# Patient Record
Sex: Male | Born: 1970 | Race: White | Hispanic: No | Marital: Married | Smoking: Never smoker
Health system: Southern US, Community
[De-identification: ages and names within clinical notes are randomized; demographics above are authoritative.]

## PROBLEM LIST (undated history)

## (undated) DIAGNOSIS — G43909 Migraine, unspecified, not intractable, without status migrainosus: Secondary | ICD-10-CM

## (undated) DIAGNOSIS — I10 Essential (primary) hypertension: Secondary | ICD-10-CM

## (undated) DIAGNOSIS — E782 Mixed hyperlipidemia: Secondary | ICD-10-CM

## (undated) DIAGNOSIS — R51 Headache: Secondary | ICD-10-CM

## (undated) DIAGNOSIS — F99 Mental disorder, not otherwise specified: Secondary | ICD-10-CM

## (undated) DIAGNOSIS — M1990 Unspecified osteoarthritis, unspecified site: Secondary | ICD-10-CM

## (undated) DIAGNOSIS — F32A Depression, unspecified: Secondary | ICD-10-CM

## (undated) DIAGNOSIS — M519 Unspecified thoracic, thoracolumbar and lumbosacral intervertebral disc disorder: Secondary | ICD-10-CM

## (undated) DIAGNOSIS — K219 Gastro-esophageal reflux disease without esophagitis: Secondary | ICD-10-CM

## (undated) DIAGNOSIS — G629 Polyneuropathy, unspecified: Secondary | ICD-10-CM

## (undated) HISTORY — DX: Mixed hyperlipidemia: E78.2

## (undated) HISTORY — DX: Gastro-esophageal reflux disease without esophagitis: K21.9

## (undated) HISTORY — PX: BACK SURGERY: SHX140

## (undated) HISTORY — DX: Migraine, unspecified, not intractable, without status migrainosus: G43.909

## (undated) HISTORY — DX: Essential (primary) hypertension: I10

## (undated) HISTORY — DX: Unspecified thoracic, thoracolumbar and lumbosacral intervertebral disc disorder: M51.9

## (undated) HISTORY — DX: Depression, unspecified: F32.A

## (undated) HISTORY — DX: Polyneuropathy, unspecified: G62.9

---

## 2006-09-11 ENCOUNTER — Emergency Department (HOSPITAL_COMMUNITY): Admission: EM | Admit: 2006-09-11 | Discharge: 2006-09-11 | Payer: Self-pay | Admitting: Emergency Medicine

## 2007-06-11 HISTORY — PX: LAMINECTOMY: SHX219

## 2008-04-29 ENCOUNTER — Observation Stay (HOSPITAL_COMMUNITY): Admission: RE | Admit: 2008-04-29 | Discharge: 2008-04-30 | Payer: Self-pay | Admitting: Neurosurgery

## 2008-04-29 HISTORY — PX: BACK SURGERY: SHX140

## 2010-01-14 IMAGING — CR DG CHEST 2V
2 series · 2 of 2 positions shown · non-contrast
Comparison: 09/11/2006

CLINICAL DATA: Lumbar herniated disc, preop.

CHEST - 2 VIEW

[view not recorded (1 of 2)]
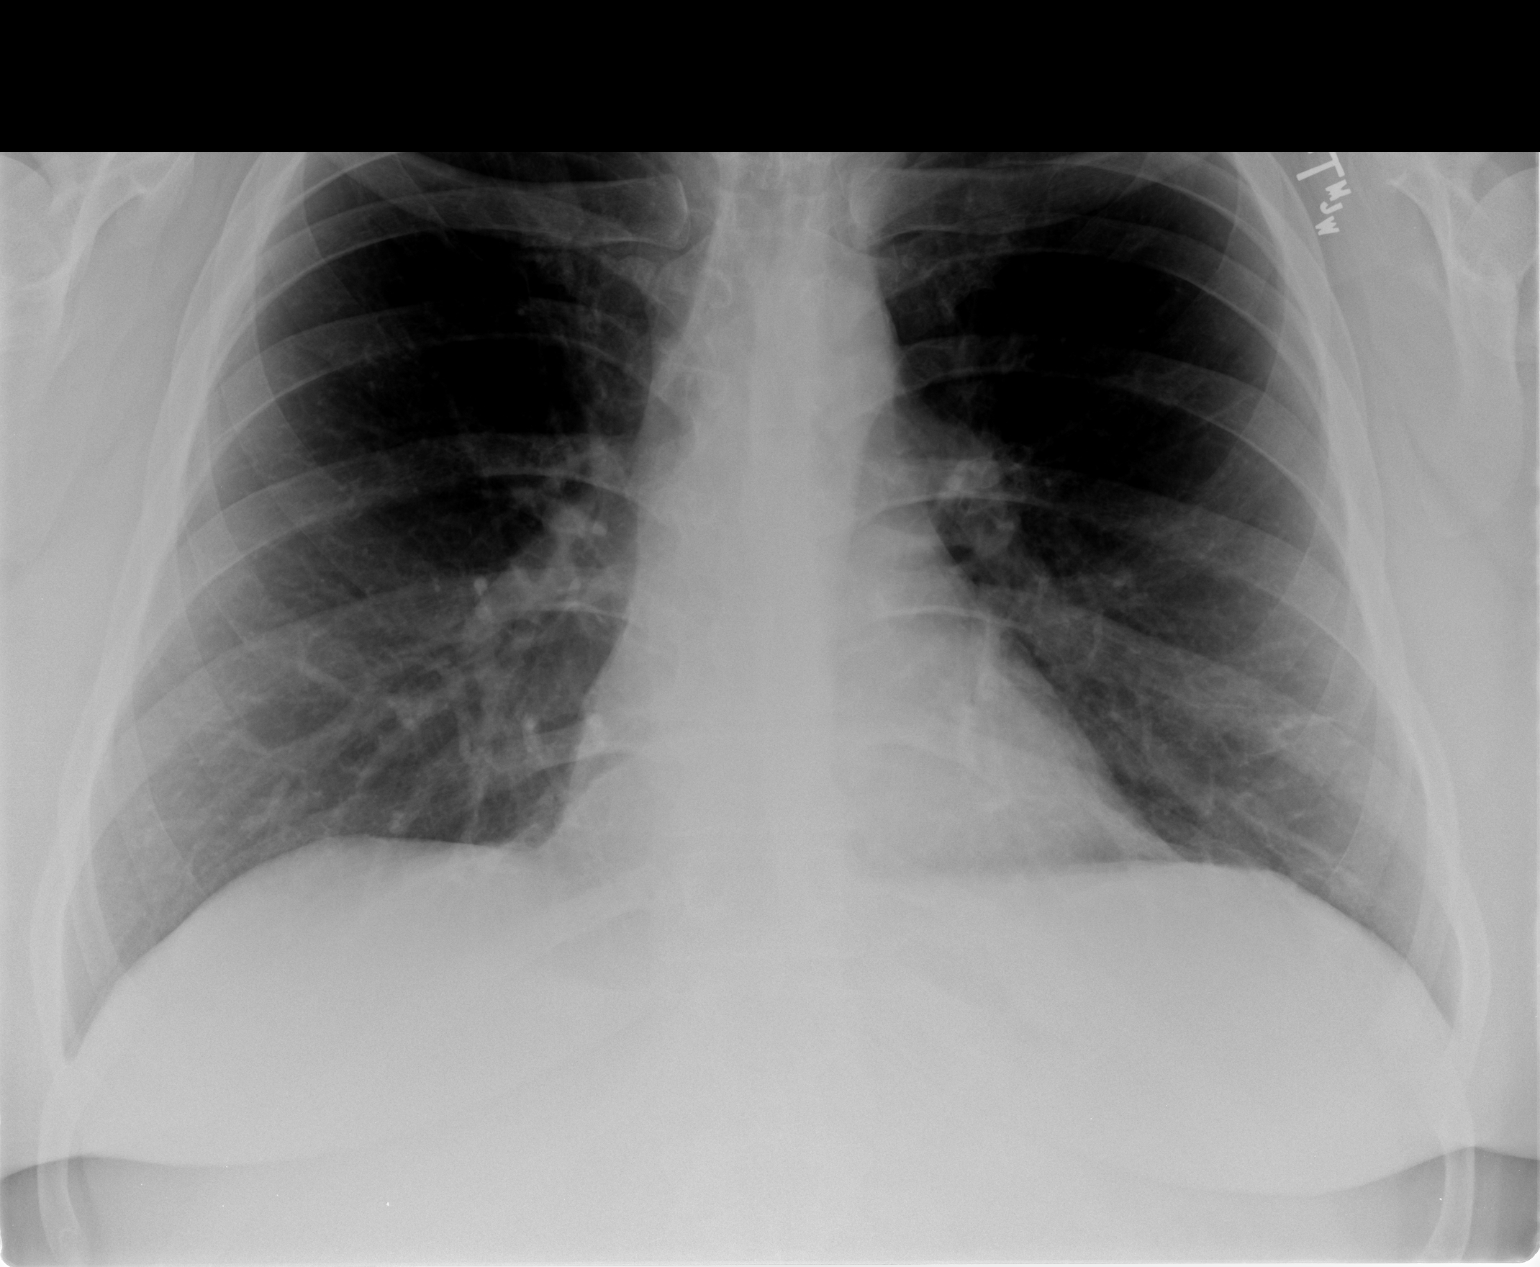

[view not recorded (2 of 2)]
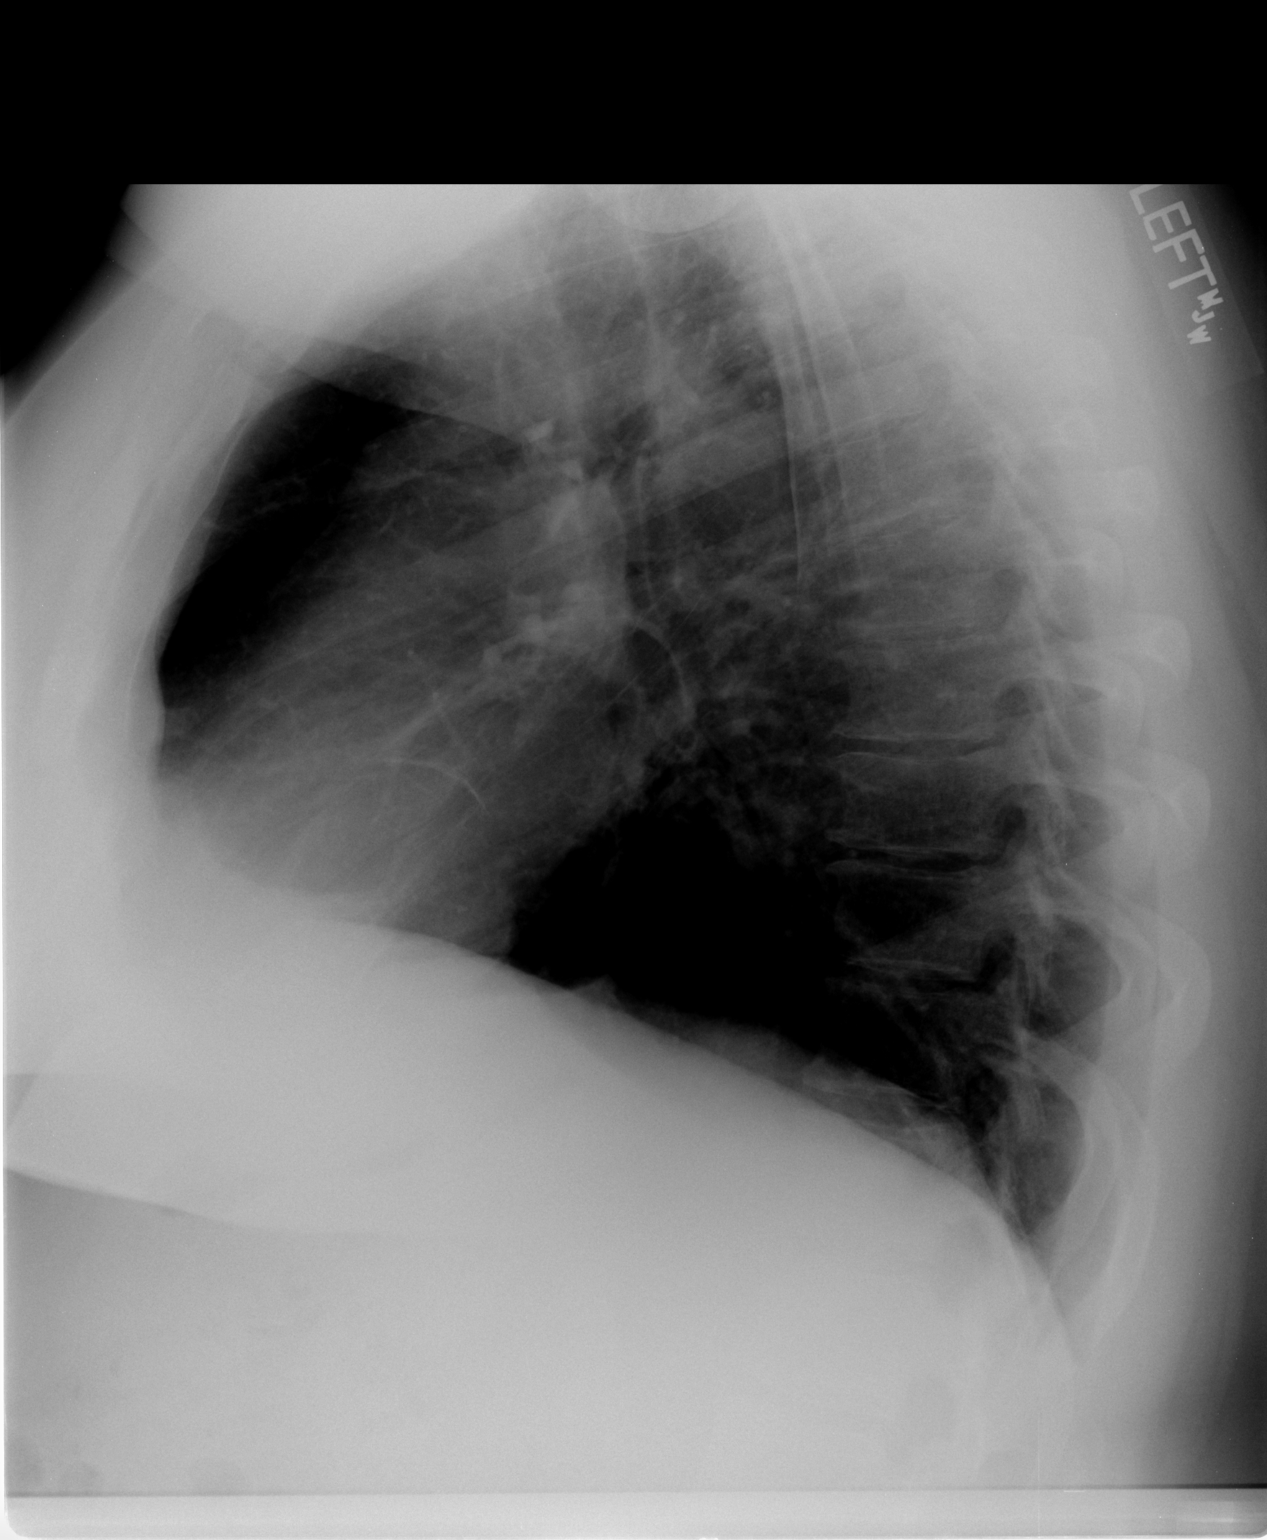

[2 of 2 positions shown; findings below may reference images not displayed]

FINDINGS: Heart and mediastinal contours are within normal limits.
No focal airspace opacities or effusions.  No acute bony
abnormality.  Early degenerative changes in the thoracic spine.
IMPRESSION: No active cardiopulmonary disease.

## 2010-03-04 ENCOUNTER — Encounter: Admission: RE | Admit: 2010-03-04 | Discharge: 2010-03-04 | Payer: Self-pay | Admitting: Orthopedic Surgery

## 2010-10-23 NOTE — Op Note (Signed)
[name], [name] NO.:  [id number]   MEDICAL RECORD NO.:  [id number]          PATIENT TYPE:  OBV   LOCATION:  3524                         FACILITY:  [facility]   PHYSICIAN:  Coletta Memos, M.D.     DATE OF BIRTH:  11/29/1970   DATE OF PROCEDURE:  04/29/2008  DATE OF DISCHARGE:                               OPERATIVE REPORT   PREOPERATIVE DIAGNOSIS:  Displaced disk far lateral position, L3-4.   POSTOPERATIVE DIAGNOSIS:  Compression left L3 nerve root and the  extraforaminal space.   INDICATIONS:  [name] is a 40 year old who has had multiple  injections into the left L3 root.  He did receive some benefit.  He had  a small disk on MRI.  I therefore offered and he agreed to undergo  operative decompression.   PROCEDURE:  Far lateral decompression with microscopic dissection, left  L3-4.   SURGEON:  Coletta Memos, MD   ASSISTANT:  Hilda Lias, MD   COMPLICATIONS:  None.   FINDINGS:  Disk was not herniated, some compression, but I did not open  the disk space.   OPERATIVE NOTE:  [name] was brought to the operating room, intubated  and placed under general anesthetic without difficulty.  He was rolled  prone onto a Wilson frame and all pressure points were properly padded.  His back was prepped.  He was draped in a sterile fashion.  I opened the  skin using #10 blade and took this down to the thoracolumbar fascia.  I  exposed the lamina of L2-L3 and with the use of intraoperative x-ray, I  was able to confirm my position.  I then used a high-speed drill to  drill out the distal lateral 2 mm of the pars.  Significant amount of  extraforaminal fat, removed some ligament, and then was able to expose  the left L3 root.  With Dr. Cassandria Santee assistance we then inspected the  disk space, but the disk space was flat.  There was a slight hump, but  not so much in the disk as was just in the bone and disk complex itself.  Not appreciating any type of fragment  of disk or bulge suggesting a  herniated piece of disk.  I then just did a thorough decompression in  the extraforaminal space.  I placed fentanyl and Depo-Medrol over the  nerve root.  I then closed wound in layered fashion using Vicryl sutures  to reapproximate the thoracolumbar subcutaneous subcuticular layers.  I  used Dermabond for sterile dressing.           ______________________________  Coletta Memos, M.D.     KC/MEDQ  D:  04/29/2008  T:  04/29/2008  Job:  [id number]

## 2011-03-12 LAB — CBC
HCT: 44
Hemoglobin: 15
MCHC: 34.2
MCV: 86.3
Platelets: 270
RBC: 5.1
RDW: 13.9

## 2011-03-12 LAB — BASIC METABOLIC PANEL
Calcium: 9.2
GFR calc non Af Amer: >60
Sodium: 138

## 2011-09-19 ENCOUNTER — Other Ambulatory Visit (HOSPITAL_COMMUNITY): Payer: Self-pay | Admitting: *Deleted

## 2011-09-19 ENCOUNTER — Ambulatory Visit (HOSPITAL_COMMUNITY)
Admission: RE | Admit: 2011-09-19 | Discharge: 2011-09-19 | Disposition: A | Discharge status: Home / Self Care | Payer: PRIVATE HEALTH INSURANCE | Source: Ambulatory Visit | Attending: *Deleted | Admitting: *Deleted

## 2011-09-19 DIAGNOSIS — R609 Edema, unspecified: Secondary | ICD-10-CM

## 2011-09-19 DIAGNOSIS — M25579 Pain in unspecified ankle and joints of unspecified foot: Secondary | ICD-10-CM | POA: Insufficient documentation

## 2011-09-19 DIAGNOSIS — M25571 Pain in right ankle and joints of right foot: Secondary | ICD-10-CM

## 2011-11-20 ENCOUNTER — Encounter (HOSPITAL_BASED_OUTPATIENT_CLINIC_OR_DEPARTMENT_OTHER): Payer: Self-pay | Admitting: *Deleted

## 2011-11-20 NOTE — Progress Notes (Signed)
Bring all medications, pack an overnight bag. Plans to come Monday for BMET and EKG.

## 2011-11-25 ENCOUNTER — Encounter (HOSPITAL_BASED_OUTPATIENT_CLINIC_OR_DEPARTMENT_OTHER)
Admission: RE | Admit: 2011-11-25 | Discharge: 2011-11-25 | Disposition: A | Discharge status: Home / Self Care | Payer: PRIVATE HEALTH INSURANCE | Source: Ambulatory Visit | Attending: Orthopedic Surgery | Admitting: Orthopedic Surgery

## 2011-11-25 LAB — BASIC METABOLIC PANEL
BUN: 17 mg/dL (ref 6–23)
GFR calc non Af Amer: >90 mL/min (ref >90)
Glucose, Bld: 82 mg/dL (ref 70–99)
Sodium: 137 mEq/L (ref 135–145)

## 2011-11-27 ENCOUNTER — Ambulatory Visit (HOSPITAL_BASED_OUTPATIENT_CLINIC_OR_DEPARTMENT_OTHER): Payer: PRIVATE HEALTH INSURANCE | Admitting: Anesthesiology

## 2011-11-27 ENCOUNTER — Encounter (HOSPITAL_BASED_OUTPATIENT_CLINIC_OR_DEPARTMENT_OTHER): Payer: Self-pay | Admitting: *Deleted

## 2011-11-27 ENCOUNTER — Encounter (HOSPITAL_BASED_OUTPATIENT_CLINIC_OR_DEPARTMENT_OTHER)
Admission: RE | Disposition: A | Discharge status: Home / Self Care | Payer: Self-pay | Source: Ambulatory Visit | Attending: Orthopedic Surgery

## 2011-11-27 ENCOUNTER — Encounter (HOSPITAL_BASED_OUTPATIENT_CLINIC_OR_DEPARTMENT_OTHER): Payer: Self-pay | Admitting: Anesthesiology

## 2011-11-27 ENCOUNTER — Encounter (HOSPITAL_BASED_OUTPATIENT_CLINIC_OR_DEPARTMENT_OTHER): Payer: Self-pay | Admitting: Certified Registered"

## 2011-11-27 ENCOUNTER — Ambulatory Visit (HOSPITAL_BASED_OUTPATIENT_CLINIC_OR_DEPARTMENT_OTHER)
Admission: RE | Admit: 2011-11-27 | Discharge: 2011-11-28 | Disposition: A | Discharge status: Home / Self Care | Payer: PRIVATE HEALTH INSURANCE | Source: Ambulatory Visit | Attending: Orthopedic Surgery | Admitting: Orthopedic Surgery

## 2011-11-27 DIAGNOSIS — M24073 Loose body in unspecified ankle: Secondary | ICD-10-CM | POA: Insufficient documentation

## 2011-11-27 DIAGNOSIS — M24176 Other articular cartilage disorders, unspecified foot: Secondary | ICD-10-CM | POA: Insufficient documentation

## 2011-11-27 DIAGNOSIS — Q742 Other congenital malformations of lower limb(s), including pelvic girdle: Secondary | ICD-10-CM | POA: Insufficient documentation

## 2011-11-27 DIAGNOSIS — M79671 Pain in right foot: Secondary | ICD-10-CM

## 2011-11-27 DIAGNOSIS — M6240 Contracture of muscle, unspecified site: Secondary | ICD-10-CM | POA: Insufficient documentation

## 2011-11-27 DIAGNOSIS — M2408 Loose body, other site: Secondary | ICD-10-CM | POA: Insufficient documentation

## 2011-11-27 DIAGNOSIS — M24473 Recurrent dislocation, unspecified ankle: Secondary | ICD-10-CM | POA: Insufficient documentation

## 2011-11-27 DIAGNOSIS — R51 Headache: Secondary | ICD-10-CM | POA: Insufficient documentation

## 2011-11-27 DIAGNOSIS — I10 Essential (primary) hypertension: Secondary | ICD-10-CM | POA: Insufficient documentation

## 2011-11-27 DIAGNOSIS — Z01810 Encounter for preprocedural cardiovascular examination: Secondary | ICD-10-CM | POA: Insufficient documentation

## 2011-11-27 DIAGNOSIS — M249 Joint derangement, unspecified: Secondary | ICD-10-CM | POA: Insufficient documentation

## 2011-11-27 DIAGNOSIS — M66879 Spontaneous rupture of other tendons, unspecified ankle and foot: Secondary | ICD-10-CM | POA: Insufficient documentation

## 2011-11-27 HISTORY — DX: Essential (primary) hypertension: I10

## 2011-11-27 HISTORY — DX: Mental disorder, not otherwise specified: F99

## 2011-11-27 HISTORY — PX: CALCANEAL OSTEOTOMY: SHX1281

## 2011-11-27 HISTORY — DX: Headache: R51

## 2011-11-27 HISTORY — DX: Unspecified osteoarthritis, unspecified site: M19.90

## 2011-11-27 SURGERY — OSTEOTOMY, CALCANEUS
Anesthesia: General | Site: Foot | Laterality: Right | Wound class: Clean

## 2011-11-27 MED ORDER — METHOCARBAMOL 500 MG PO TABS
500.0000 mg | ORAL_TABLET | Freq: Four times a day (QID) | ORAL | Status: DC | PRN
  Administered 2011-11-28: 500 mg via ORAL

## 2011-11-27 MED ORDER — FENTANYL CITRATE 0.05 MG/ML IJ SOLN
50.0000 ug | INTRAMUSCULAR | Status: DC | PRN
  Administered 2011-11-27: 100 ug via INTRAVENOUS

## 2011-11-27 MED ORDER — ONDANSETRON HCL 4 MG/2ML IJ SOLN
INTRAMUSCULAR | Status: DC | PRN
  Administered 2011-11-27: 4 mg via INTRAVENOUS

## 2011-11-27 MED ORDER — DIPHENHYDRAMINE HCL 12.5 MG/5ML PO ELIX: 12.5000 mg | ORAL_SOLUTION | ORAL | Status: DC | PRN

## 2011-11-27 MED ORDER — FENTANYL CITRATE 0.05 MG/ML IJ SOLN
INTRAMUSCULAR | Status: DC | PRN
  Administered 2011-11-27 (×2): 50 ug via INTRAVENOUS

## 2011-11-27 MED ORDER — LACTATED RINGERS IV SOLN
INTRAVENOUS | Status: DC
  Administered 2011-11-27 (×3): via INTRAVENOUS

## 2011-11-27 MED ORDER — EPHEDRINE SULFATE 50 MG/ML IJ SOLN
INTRAMUSCULAR | Status: DC | PRN
  Administered 2011-11-27: 10 mg via INTRAVENOUS
  Administered 2011-11-27: 5 mg via INTRAVENOUS

## 2011-11-27 MED ORDER — DROPERIDOL 2.5 MG/ML IJ SOLN
INTRAMUSCULAR | Status: DC | PRN
  Administered 2011-11-27: 0.625 mg via INTRAVENOUS

## 2011-11-27 MED ORDER — MORPHINE SULFATE 2 MG/ML IJ SOLN: 1.0000 mg | INTRAMUSCULAR | Status: DC | PRN

## 2011-11-27 MED ORDER — VANCOMYCIN HCL IN DEXTROSE 1-5 GM/200ML-% IV SOLN: 1000.0000 mg | INTRAVENOUS | Status: DC

## 2011-11-27 MED ORDER — AMPHETAMINE-DEXTROAMPHETAMINE 7.5 MG PO TABS: 7.5000 mg | ORAL_TABLET | Freq: Every day | ORAL | Status: DC

## 2011-11-27 MED ORDER — ASPIRIN EC 325 MG PO TBEC: 325.0000 mg | DELAYED_RELEASE_TABLET | Freq: Two times a day (BID) | ORAL | Status: AC

## 2011-11-27 MED ORDER — DULOXETINE HCL 60 MG PO CPEP: 60.0000 mg | ORAL_CAPSULE | Freq: Every day | ORAL | Status: DC

## 2011-11-27 MED ORDER — LIDOCAINE HCL (CARDIAC) 20 MG/ML IV SOLN
INTRAVENOUS | Status: DC | PRN
  Administered 2011-11-27: 60 mg via INTRAVENOUS

## 2011-11-27 MED ORDER — VANCOMYCIN HCL 1000 MG IV SOLR
1500.0000 mg | INTRAVENOUS | Status: AC
  Administered 2011-11-27: 1500 mg via INTRAVENOUS

## 2011-11-27 MED ORDER — HYDROMORPHONE HCL PF 1 MG/ML IJ SOLN
0.2500 mg | INTRAMUSCULAR | Status: DC | PRN
  Administered 2011-11-27 (×2): 0.5 mg via INTRAVENOUS

## 2011-11-27 MED ORDER — ONDANSETRON HCL 4 MG/2ML IJ SOLN: 4.0000 mg | Freq: Four times a day (QID) | INTRAMUSCULAR | Status: DC | PRN

## 2011-11-27 MED ORDER — OXYCODONE-ACETAMINOPHEN 5-325 MG PO TABS: 1.0000 | ORAL_TABLET | ORAL | Status: AC | PRN

## 2011-11-27 MED ORDER — MIDAZOLAM HCL 2 MG/2ML IJ SOLN
0.5000 mg | INTRAMUSCULAR | Status: DC | PRN
  Administered 2011-11-27: 2 mg via INTRAVENOUS

## 2011-11-27 MED ORDER — VITAMIN C 500 MG PO TABS: 500.0000 mg | ORAL_TABLET | Freq: Every day | ORAL | Status: AC

## 2011-11-27 MED ORDER — DEXAMETHASONE SODIUM PHOSPHATE 4 MG/ML IJ SOLN
INTRAMUSCULAR | Status: DC | PRN
  Administered 2011-11-27: 8 mg via INTRAVENOUS

## 2011-11-27 MED ORDER — METHOCARBAMOL 100 MG/ML IJ SOLN: 500.0000 mg | Freq: Four times a day (QID) | INTRAVENOUS | Status: DC | PRN

## 2011-11-27 MED ORDER — ONDANSETRON HCL 4 MG PO TABS: 4.0000 mg | ORAL_TABLET | Freq: Four times a day (QID) | ORAL | Status: DC | PRN

## 2011-11-27 MED ORDER — BUPIVACAINE HCL (PF) 0.5 % IJ SOLN
INTRAMUSCULAR | Status: DC | PRN
  Administered 2011-11-27: 15 mL

## 2011-11-27 MED ORDER — METOCLOPRAMIDE HCL 5 MG PO TABS: 5.0000 mg | ORAL_TABLET | Freq: Three times a day (TID) | ORAL | Status: DC | PRN

## 2011-11-27 MED ORDER — SODIUM CHLORIDE 0.9 % IV SOLN
INTRAVENOUS | Status: DC
  Administered 2011-11-27: 16:00:00 via INTRAVENOUS

## 2011-11-27 MED ORDER — PANTOPRAZOLE SODIUM 40 MG PO TBEC: 40.0000 mg | DELAYED_RELEASE_TABLET | Freq: Every day | ORAL | Status: DC

## 2011-11-27 MED ORDER — TEMAZEPAM 15 MG PO CAPS: 15.0000 mg | ORAL_CAPSULE | Freq: Every evening | ORAL | Status: DC | PRN

## 2011-11-27 MED ORDER — OMEGA-3 FATTY ACIDS 1000 MG PO CAPS: 2.0000 g | ORAL_CAPSULE | Freq: Every day | ORAL | Status: DC

## 2011-11-27 MED ORDER — LISINOPRIL 20 MG PO TABS: 20.0000 mg | ORAL_TABLET | Freq: Every day | ORAL | Status: DC

## 2011-11-27 MED ORDER — BUPIVACAINE-EPINEPHRINE PF 0.5-1:200000 % IJ SOLN
INTRAMUSCULAR | Status: DC | PRN
  Administered 2011-11-27: 30 mL

## 2011-11-27 MED ORDER — METOCLOPRAMIDE HCL 5 MG/ML IJ SOLN: 5.0000 mg | Freq: Three times a day (TID) | INTRAMUSCULAR | Status: DC | PRN

## 2011-11-27 MED ORDER — PROPOFOL 10 MG/ML IV EMUL
INTRAVENOUS | Status: DC | PRN
  Administered 2011-11-27: 200 mg via INTRAVENOUS

## 2011-11-27 MED ORDER — ONE-DAILY MULTI VITAMINS PO TABS: 1.0000 | ORAL_TABLET | Freq: Every day | ORAL | Status: DC

## 2011-11-27 MED ORDER — OXYCODONE-ACETAMINOPHEN 5-325 MG PO TABS
1.0000 | ORAL_TABLET | ORAL | Status: DC | PRN
  Administered 2011-11-27: 1 via ORAL
  Administered 2011-11-27 – 2011-11-28 (×2): 2 via ORAL

## 2011-11-27 MED ORDER — PREGABALIN 75 MG PO CAPS
150.0000 mg | ORAL_CAPSULE | Freq: Two times a day (BID) | ORAL | Status: DC
  Administered 2011-11-27: 150 mg via ORAL

## 2011-11-27 MED ORDER — MELOXICAM 15 MG PO TABS: 15.0000 mg | ORAL_TABLET | Freq: Every day | ORAL | Status: DC

## 2011-11-27 MED ORDER — METHOCARBAMOL 500 MG PO TABS: 500.0000 mg | ORAL_TABLET | Freq: Three times a day (TID) | ORAL | Status: AC

## 2011-11-27 MED ORDER — SODIUM CHLORIDE 0.9 % IV SOLN: INTRAVENOUS | Status: DC

## 2011-11-27 MED ORDER — CHLORHEXIDINE GLUCONATE 4 % EX LIQD: 60.0000 mL | Freq: Once | CUTANEOUS | Status: DC

## 2011-11-27 SURGICAL SUPPLY — 94 items
APL SKNCLS STERI-STRIP NONHPOA (GAUZE/BANDAGES/DRESSINGS) ×1
ARTHREX 2.5 DRILL ×1 IMPLANT
BANDAGE ELASTIC 4 VELCRO ST LF (GAUZE/BANDAGES/DRESSINGS) ×2 IMPLANT
BANDAGE ELASTIC 6 VELCRO ST LF (GAUZE/BANDAGES/DRESSINGS) ×2 IMPLANT
BENZOIN TINCTURE PRP APPL 2/3 (GAUZE/BANDAGES/DRESSINGS) ×2 IMPLANT
BLADE AVERAGE 25X9 (BLADE) ×2 IMPLANT
BLADE CCA MICRO SAG (BLADE) IMPLANT
BLADE MICRO SAGITTAL (BLADE) IMPLANT
BLADE OSC/SAG .038X5.5 CUT EDG (BLADE) IMPLANT
BLADE SURG 11 STRL SS (BLADE) IMPLANT
BLADE SURG 15 STRL LF DISP TIS (BLADE) ×4 IMPLANT
BLADE SURG 15 STRL SS (BLADE) ×12
BRUSH SCRUB EZ PLAIN DRY (MISCELLANEOUS) ×2 IMPLANT
BUR EGG 3PK/BX (BURR) IMPLANT
CLOTH BEACON ORANGE TIMEOUT ST (SAFETY) ×2 IMPLANT
COTTON STERILE ROLL (GAUZE/BANDAGES/DRESSINGS) ×2 IMPLANT
COVER TABLE BACK 60X90 (DRAPES) ×2 IMPLANT
CUFF TOURNIQUET SINGLE 34IN LL (TOURNIQUET CUFF) ×1 IMPLANT
CUFF TOURNIQUET SINGLE 44IN (TOURNIQUET CUFF) ×1 IMPLANT
DRAPE EXTREMITY T 121X128X90 (DRAPE) ×2 IMPLANT
DRAPE INCISE IOBAN 66X45 STRL (DRAPES) IMPLANT
DRAPE OEC MINIVIEW 54X84 (DRAPES) ×2 IMPLANT
DRAPE PED LAPAROTOMY (DRAPES) IMPLANT
DRAPE SURG 17X23 STRL (DRAPES) ×3 IMPLANT
DRSG PAD ABDOMINAL 8X10 ST (GAUZE/BANDAGES/DRESSINGS) ×2 IMPLANT
DURAPREP 26ML APPLICATOR (WOUND CARE) ×1 IMPLANT
ELECT REM PT RETURN 9FT ADLT (ELECTROSURGICAL) ×2
ELECTRODE REM PT RTRN 9FT ADLT (ELECTROSURGICAL) ×1 IMPLANT
GAUZE SPONGE 4X4 16PLY XRAY LF (GAUZE/BANDAGES/DRESSINGS) IMPLANT
GAUZE XEROFORM 1X8 LF (GAUZE/BANDAGES/DRESSINGS) ×2 IMPLANT
GAUZE XEROFORM 5X9 LF (GAUZE/BANDAGES/DRESSINGS) IMPLANT
GLOVE BIO SURGEON STRL SZ 6.5 (GLOVE) ×1 IMPLANT
GLOVE BIO SURGEON STRL SZ8 (GLOVE) ×2 IMPLANT
GLOVE BIOGEL PI IND STRL 7.0 (GLOVE) IMPLANT
GLOVE BIOGEL PI IND STRL 8 (GLOVE) ×2 IMPLANT
GLOVE BIOGEL PI INDICATOR 7.0 (GLOVE) ×1
GLOVE BIOGEL PI INDICATOR 8 (GLOVE) ×3
GLOVE SURG SS PI 8.0 STRL IVOR (GLOVE) ×3 IMPLANT
GOWN BRE IMP PREV XXLGXLNG (GOWN DISPOSABLE) ×2 IMPLANT
GOWN PREVENTION PLUS XLARGE (GOWN DISPOSABLE) ×3 IMPLANT
GRAFT BNE HEAD FEM 43 (Bone Implant) IMPLANT
GRAFT HEAD FEMORAL 43MM TISSUE (Bone Implant) ×2 IMPLANT
GUIDEWIRE 1.6 (WIRE) ×2
GUIDEWIRE 2.4 HINDFOOT (WIRE) ×2
GUIDEWIRE ORTH 157X1.6XTROC (WIRE) IMPLANT
GUIDEWIRE W/TROCAR TIP .094X8 (WIRE) ×2 IMPLANT
NDL SUT 6 .5 CRC .975X.05 MAYO (NEEDLE) IMPLANT
NEEDLE HYPO 22GX1.5 SAFETY (NEEDLE) IMPLANT
NEEDLE MAYO TAPER (NEEDLE)
NS IRRIG 1000ML POUR BTL (IV SOLUTION) ×3 IMPLANT
PACK BASIN DAY SURGERY FS (CUSTOM PROCEDURE TRAY) ×2 IMPLANT
PAD CAST 4YDX4 CTTN HI CHSV (CAST SUPPLIES) ×1 IMPLANT
PADDING CAST ABS 4INX4YD NS (CAST SUPPLIES) ×1
PADDING CAST ABS COTTON 4X4 ST (CAST SUPPLIES) IMPLANT
PADDING CAST COTTON 4X4 STRL (CAST SUPPLIES) ×4
PASSER SUT SWANSON 36MM LOOP (INSTRUMENTS) ×1 IMPLANT
PENCIL BUTTON HOLSTER BLD 10FT (ELECTRODE) ×2 IMPLANT
SCREW CANN 6.7X55 18 THD SD (Screw) ×2 IMPLANT
SCREW LP TI 6.7X50M CANN 18THR (Screw) ×1 IMPLANT
SCREW LP TITANIUM 3.5X44 (Screw) ×1 IMPLANT
SHEET MEDIUM DRAPE 40X70 STRL (DRAPES) ×4 IMPLANT
SLEEVE SCD COMPRESS KNEE MED (MISCELLANEOUS) ×1 IMPLANT
SPLINT FAST PLASTER 5X30 (CAST SUPPLIES) ×20
SPLINT PLASTER CAST FAST 5X30 (CAST SUPPLIES) IMPLANT
SPONGE GAUZE 4X4 12PLY (GAUZE/BANDAGES/DRESSINGS) ×2 IMPLANT
SPONGE LAP 4X18 X RAY DECT (DISPOSABLE) ×2 IMPLANT
STOCKINETTE 6  STRL (DRAPES) ×1
STOCKINETTE 6 STRL (DRAPES) ×1 IMPLANT
STRIP CLOSURE SKIN 1/2X4 (GAUZE/BANDAGES/DRESSINGS) ×2 IMPLANT
SUCTION FRAZIER TIP 10 FR DISP (SUCTIONS) ×1 IMPLANT
SUT 2 FIBERLOOP 20 STRT BLUE (SUTURE)
SUT BONE WAX W31G (SUTURE) ×2 IMPLANT
SUT ETHIBOND 0 MO6 C/R (SUTURE) IMPLANT
SUT ETHIBOND 2 OS 4 DA (SUTURE) IMPLANT
SUT ETHILON 4 0 PS 2 18 (SUTURE) ×6 IMPLANT
SUT FIBERWIRE #2 38 T-5 BLUE (SUTURE)
SUT FIBERWIRE 2-0 18 17.9 3/8 (SUTURE) ×4
SUT MNCRL AB 4-0 PS2 18 (SUTURE) ×2 IMPLANT
SUT VIC AB 0 SH 27 (SUTURE) IMPLANT
SUT VIC AB 2-0 PS2 27 (SUTURE) ×1 IMPLANT
SUT VIC AB 2-0 SH 18 (SUTURE) IMPLANT
SUT VIC AB 2-0 SH 27 (SUTURE)
SUT VIC AB 2-0 SH 27XBRD (SUTURE) IMPLANT
SUT VIC AB 3-0 PS1 18 (SUTURE) ×6
SUT VIC AB 3-0 PS1 18XBRD (SUTURE) ×2 IMPLANT
SUTURE 2 FIBERLOOP 20 STRT BLU (SUTURE) IMPLANT
SUTURE FIBERWR #2 38 T-5 BLUE (SUTURE) IMPLANT
SUTURE FIBERWR 2-0 18 17.9 3/8 (SUTURE) ×1 IMPLANT
SYR BULB 3OZ (MISCELLANEOUS) ×2 IMPLANT
SYR CONTROL 10ML LL (SYRINGE) IMPLANT
TOWEL OR 17X24 6PK STRL BLUE (TOWEL DISPOSABLE) ×6 IMPLANT
TOWEL OR NON WOVEN STRL DISP B (DISPOSABLE) ×2 IMPLANT
TUBE CONNECTING 20X1/4 (TUBING) ×4 IMPLANT
WATER STERILE IRR 1000ML POUR (IV SOLUTION) ×2 IMPLANT

## 2011-11-27 NOTE — Anesthesia Postprocedure Evaluation (Signed)
  Anesthesia Post-op Note  Patient: [name]  Procedure(s) Performed: Procedure(s) (LRB): CALCANEAL OSTEOTOMY (Right) TENDON TRANSFER (Right) ARTHROTOMY (Right)  Patient Location: PACU  Anesthesia Type: GA combined with regional for post-op pain  Level of Consciousness: awake and sedated  Airway and Oxygen Therapy: Patient Spontanous Breathing and Patient connected to face mask oxygen  Post-op Pain: none  Post-op Assessment: Post-op Vital signs reviewed, Patient's Cardiovascular Status Stable, Respiratory Function Stable, Patent Airway and No signs of Nausea or vomiting  Post-op Vital Signs: Reviewed and stable  Complications: No apparent anesthesia complications

## 2011-11-27 NOTE — H&P (Signed)
  H&P documentation: Placed to be scanned history and physical exam in chart.  -History and Physical Reviewed  -Patient has been re-examined  -No change in the plan of care  Noelene Gang A  

## 2011-11-27 NOTE — Op Note (Signed)
[name], [name]                ACCOUNT NO.:  [id number]  MEDICAL RECORD NO.:  [id number]  LOCATION:  RAD                           FACILITY:  [facility]  PHYSICIAN:  Leonides Grills, M.D.     DATE OF BIRTH:  [birth year]  DATE OF PROCEDURE:  11/27/2011 DATE OF DISCHARGE:                              OPERATIVE REPORT   PREOPERATIVE DIAGNOSES: 1. Right posterior tibial tendon rupture. 2. Right hindfoot valgus malalignment. 3. Right tight gastroc. 4. Right dorsal talonavicular joint loose body. 5. Right os trigonum. 6. Right posterior ankle impingement.  POSTOPERATIVE DIAGNOSES: 1. Right posterior tibial tendon rupture. 2. Right hindfoot valgus malalignment. 3. Right tight gastroc. 4. Right dorsal talonavicular joint loose body. 5. Right os trigonum. 6. Right posterior ankle impingement.  OPERATION: 1. Right lengthening calcaneal osteotomy with femoral head allograft. 2. Right medial calcaneal osteotomy. 3. Right FDL to navicular tendon transfer. 4. Right gastroc slide. 5. Right excision os trigonum. 6. Right posterior ankle arthrotomy tenosynovectomy. 7. Excision dorsal talonavicular joint loose body. 8. Stress x-rays, right foot.  ANESTHESIA:  General.  SURGEON:  Leonides Grills, MD  ASSISTANT:  Richardean Canal, PA-C  ESTIMATED BLOOD LOSS:  Minimal.  TOURNIQUET TIME:  2 hours.  COMPLICATIONS:  None.  DISPOSITION:  Stable to PR.  INDICATION:  This is a 41 year old male who has had long-standing progressive pain due to the above pathology.  Despite conservative management it was interfering with his life to the point where he cannot do what he wants to do.  He was consented for the above procedure.  All risks of infection, vessel injury, nonunion, malunion, hardware irritation, hardware failure, persistent pain, worse pain, prolonged recovery, stiffness, arthritis, wound healing problems, DVT, PE, weakness, and contracture were all explained.  Questions were encouraged and  answered.  OPERATION:  The patient was brought to the operating room and placed in a supine position after adequate general anesthesia was administered as well as Ancef 1 g IV piggyback.  Bump was placed on right ipsilateral hip internally rotating right lower extremity.  All bony prominences were well padded.  Right lower extremity was then prepped and draped in usual sterile manner over proximally thigh tourniquet.  We started the procedure with a longitudinal incision on the medial aspect of gastrocnemius muscle tendinous junction.  Dissection was carried down through skin.  Hemostasis was obtained.  Fascia was opened in line with the incision.  Conjoined region was then developed doing gastroc soleus. Soft tissue was elevated off the posterior aspect of gastrocnemius. Sural nerve identified and protected posteriorly throughout the case. Gastrocnemius then released with a curved Mayo scissors.  This had extra release of tight gastroc.  The area was copiously irrigated with saline. Subcu was closed with 3-0 Vicryl.  Skin was closed with 4-0 nylon.  Limb was then gravity exsanguinated and tourniquet elevated to 290 mmHg.  A longitudinal incision over the posterolateral aspect of the right ankle was made.  Dissection was carried down through skin.  Hemostasis was obtained.  Sural nerve identified and protected and retracted anteriorly throughout the case.  Careful dissection was carried out.  The posterior aspect of the ankle, os trigonum was identified.  This was then carefully dissected out, and then removed protecting the patellar tendon, which was identified at all times.  Posterior ankle arthrotomy was then made and tenosynovectomy was then performed in this area as well.  We then ranged the ankle, there was no impingement in this area. The area was copiously irrigated with normal saline.  We then obtained a C-arm view to verify that this was adequately decompressed.  We then made a  longitudinal incision in the lateral aspect of the calcaneus. Calcaneal tuber dissection was carried down through skin.  Hemostasis was obtained.  Careful dissection was carried down to the lateral wall. Soft tissue was elevated both superiorly and inferiorly and Hohmann retractors in place, then with a sagittal saw an osteotomy was then made perpendicular to lateral wall and calcaneal pitch.  Once this was completed, we then placed a lamina spreader within the osteotomy to stretch the soft tissues.  We then translated the tuber not only medially but inferiorly as well not only medializing the heel, but helping with the calcaneal pitch.  Once this was done, we then placed two 6.5 mm partially threaded Arthrex titanium screws self-drilling self- tapping over K-wires through an incision on the posterior aspect of the heel.  This had excellent purchase and maintenance of the desired position.  Stress x-ray was obtained in the lateral and axial views and showed no gross motion fixation proposition and excellent alignment as well.  We then made a longitudinal incision over the calcaneal neck. Dissection was carried down through skin.  Hemostasis was obtained. Peroneal tendon was identified and retracted out of harm's way throughout the case.  CC joint was identified approximately 1.2 cm proximal to this.  An osteotomy was then made in the calcaneus.  This was perpendicular to the lateral wall as well.  Once this was completed, we then placed a laminar spreader in this area and then lengthened the calcaneus.  We then measured the length desired, this was about 8 mm. We then used the femoral head and fashioned a block that would fit perfectly in this area that was slightly trapezoidal in shape and 8 mm at its widest diameter laterally.  We then tamped this into place carefully maintaining reduction of the CC joint.  Once this was done, we verified that this was in the proper position with C-arm  in the AP and lateral planes.  We then fixed this with a 3.5 mm fully-threaded cortical set screw using a 2.5 mm drill hole respectively.  This was an Arthrex 3.5 screw that had a star head.  Once this was done, the area was copiously irrigated with normal saline.  We then obtained stress x- rays in the AP and lateral planes which showed no gross motion fixation, proposition, excellent alignment as well.  Clinically the arch was corrected as well.  Also to the AP talocalcaneal angle was corrected as well as the medial talar head on coverage was corrected as well.  We then made a longitudinal incision on the dorsal aspect of the right foot over the loose body of the talonavicular joint.  Dissection was carried down through skin.  Hemostasis was obtained.  Extensor retinaculum was incised and the tendons were retracted out of harm's way.  We then made a longitudinal incision over the capsule and arthrotomy was then made of the talonavicular joint.  We then carefully dissected out the loose body and this was then removed.  Once this was removed, we then obtained a C- arm  view to verify this was adequately removed.  We then ranged the joint and the joint had no impingement.  The area was copiously irrigated with normal saline.  We then made a longitudinal incision over the posterior tibial tendon.  Dissection was carried out through skin. Hemostasis was obtained.  The flexor retinaculum over the posterior tibial tendon was then incised.  The posterior tibial tendon was grossly ruptured.  Proximal limb was not even seen.  We then identified the FDL tendon and traced this to the knot of Henry and then tenotomized this as distal as possible.  The distal portion of posterior tibial tendon was well maintained and we decided to incorporate the FDL into the distal posterior tibial tendon.  We then made an incision in the distal aspect of posterior tibial tendon and woke the FDL through the posterior  tibial tendon.  This was then reconstructed and sewn with a 2-0 FiberWire stitch.  This had an Conservation officer, historic buildings.  The area was copiously irrigated with normal saline.  We then repaired the retinaculum with 3-0 Vicryl stitches, had an outstanding repair.  The area was copiously irrigated with normal saline.  Tourniquet was deflated.  Hemostasis was obtained.  There was no pulsatile bleeding.  Subcu was closed with 3-0 Vicryl over all wounds.  Skin was closed with 4-0 nylon over all wounds. Sterile dressings were applied.  Modified Jones dressing was applied with ankle in dorsiflexion.  The patient was stable to PR.  Of note, the first ray was adequately plantar flexed as well at the end of the procedure.     Leonides Grills, M.D.     PB/MEDQ  D:  11/27/2011  T:  11/27/2011  Job:  [id number]

## 2011-11-27 NOTE — Discharge Instructions (Signed)
°  Post Anesthesia Home Care Instructions ° °Activity: °Get plenty of rest for the remainder of the day. A responsible adult should stay with you for 24 hours following the procedure.  °For the next 24 hours, DO NOT: °-Drive a car °-Operate machinery °-Drink alcoholic beverages °-Take any medication unless instructed by your physician °-Make any legal decisions or sign important papers. ° °Meals: °Start with liquid foods such as gelatin or soup. Progress to regular foods as tolerated. Avoid greasy, spicy, heavy foods. If nausea and/or vomiting occur, drink only clear liquids until the nausea and/or vomiting subsides. Call your physician if vomiting continues. ° °Special Instructions/Symptoms: °Your throat may feel dry or sore from the anesthesia or the breathing tube placed in your throat during surgery. If this causes discomfort, gargle with warm salt water. The discomfort should disappear within 24 hours. ° °Regional Anesthesia Blocks ° °1. Numbness or the inability to move the "blocked" extremity may last from 3-48 hours after placement. The length of time depends on the medication injected and your individual response to the medication. If the numbness is not going away after 48 hours, call your surgeon. ° °2. The extremity that is blocked will need to be protected until the numbness is gone and the  Strength has returned. Because you cannot feel it, you will need to take extra care to avoid injury. Because it may be weak, you may have difficulty moving it or using it. You may not know what position it is in without looking at it while the block is in effect. ° °3. For blocks in the legs and feet, returning to weight bearing and walking needs to be done carefully. You will need to wait until the numbness is entirely gone and the strength has returned. You should be able to move your leg and foot normally before you try and bear weight or walk. You will need someone to be with you when you first try to ensure you  do not fall and possibly risk injury. ° °4. Bruising and tenderness at the needle site are common side effects and will resolve in a few days. ° °5. Persistent numbness or new problems with movement should be communicated to the surgeon or the [facility] ([phone])/ [facility] ([phone]). °

## 2011-11-27 NOTE — Anesthesia Procedure Notes (Addendum)
Anesthesia Regional Block:  Popliteal block  Pre-Anesthetic Checklist: ,, timeout performed, Correct Patient, Correct Site, Correct Laterality, Correct Procedure, Correct Position, site marked, Risks and benefits discussed, pre-op evaluation, post-op pain management  Laterality: Right  Prep: Maximum Sterile Barrier Precautions used and chloraprep       Needles:  Injection technique: Single-shot  Needle Type: Echogenic Stimulator Needle          Additional Needles:  Procedures: ultrasound guided and nerve stimulator Popliteal block  Nerve Stimulator or Paresthesia:  Response: Peroneal,  Response: Tibial,   Additional Responses:   Narrative:  Start time: 11/27/2011 9:11 AM End time: 11/27/2011 9:23 AM Injection made incrementally with aspirations every 5 mL. Anesthesiologist: Sampson Goon, MD  Additional Notes: 2% Lidocaine skin wheel. Saphenous block with 5cc of 0.5% Bupivicaine plain.    Popliteal block Procedure Name: LMA Insertion Date/Time: 11/27/2011 10:27 AM Performed by: Verlan Friends Pre-anesthesia Checklist: Patient identified, Emergency Drugs available, Suction available, Patient being monitored and Timeout performed Patient Re-evaluated:Patient Re-evaluated prior to inductionOxygen Delivery Method: Circle System Utilized Preoxygenation: Pre-oxygenation with 100% oxygen Intubation Type: IV induction Ventilation: Mask ventilation without difficulty LMA: LMA with gastric port inserted LMA Size: 5.0 Number of attempts: 1 (atraumatic) Placement Confirmation: positive ETCO2 Tube secured with: Tape Dental Injury: Teeth and Oropharynx as per pre-operative assessment

## 2011-11-27 NOTE — Progress Notes (Signed)
Assisted Dr. Fitzgerald with right, ultrasound guided, popliteal block. Side rails up, monitors on throughout procedure. See vital signs in flow sheet. Tolerated Procedure well. 

## 2011-11-27 NOTE — Transfer of Care (Signed)
Immediate Anesthesia Transfer of Care Note  Patient: [name]  Procedure(s) Performed: Procedure(s) (LRB): CALCANEAL OSTEOTOMY (Right) TENDON TRANSFER (Right) ARTHROTOMY (Right)  Patient Location: PACU  Anesthesia Type: GA combined with regional for post-op pain  Level of Consciousness: awake, alert , oriented and patient cooperative  Airway & Oxygen Therapy: Patient Spontanous Breathing and Patient connected to face mask oxygen  Post-op Assessment: Report given to PACU RN and Post -op Vital signs reviewed and stable  Post vital signs: Reviewed and stable  Complications: No apparent anesthesia complications

## 2011-11-27 NOTE — Brief Op Note (Signed)
11/27/2011  1:29 PM  PATIENT:  [name]  41 y.o. male  PRE-OPERATIVE DIAGNOSIS:  right dorsal talonavicular loose body, ostrigoium posterior ankle impingement, tight gastroc, valgus hindfoot malalignment, posterior tibial tendon rupture  POST-OPERATIVE DIAGNOSIS:  Right Dorsal Talonavicular Loose Body, Ostrigoium Posterior Ankle   PROCEDURE:  Procedure(s) (LRB): CALCANEAL OSTEOTOMY (Right) TENDON TRANSFER (Right) ARTHROTOMY (Right)  SURGEON:  Surgeon(s) and Role:    * Sherri Rad, MD - Primary  PHYSICIAN ASSISTANT: Rexene Edison, PAC   ASSISTANTS: Above   ANESTHESIA:   general  EBL:  Total I/O In: 2000 [I.V.:2000] Out: -   BLOOD ADMINISTERED:none  DRAINS: none   LOCAL MEDICATIONS USED:  NONE  SPECIMEN:  No Specimen  DISPOSITION OF SPECIMEN:  N/A  COUNTS:  YES  TOURNIQUET:   Total Tourniquet Time Documented: Thigh (Right) - 121 minutes  DICTATION: .Other Dictation: Dictation Number [id number]  PLAN OF CARE: Admit for overnight observation  PATIENT DISPOSITION:  PACU - hemodynamically stable.   Delay start of Pharmacological VTE agent (>24hrs) due to surgical blood loss or risk of bleeding: no

## 2011-11-27 NOTE — Anesthesia Preprocedure Evaluation (Signed)
Anesthesia Evaluation  Patient identified by MRN, date of birth, ID band Patient awake    Reviewed: Allergy & Precautions, H&P , NPO status , Patient's Chart, lab work & pertinent test results  Airway Mallampati: I TM Distance: >3 FB Neck ROM: Full    Dental No notable dental hx. (+) Teeth Intact and Dental Advisory Given   Pulmonary neg pulmonary ROS,  breath sounds clear to auscultation  Pulmonary exam normal       Cardiovascular hypertension, On Medications Rhythm:Regular Rate:Normal     Neuro/Psych  Headaches, PSYCHIATRIC DISORDERS    GI/Hepatic negative GI ROS, Neg liver ROS,   Endo/Other  negative endocrine ROSMorbid obesity  Renal/GU negative Renal ROS  negative genitourinary   Musculoskeletal   Abdominal   Peds  Hematology negative hematology ROS (+)   Anesthesia Other Findings   Reproductive/Obstetrics negative OB ROS                           Anesthesia Physical Anesthesia Plan  ASA: III  Anesthesia Plan: General   Post-op Pain Management:    Induction: Intravenous  Airway Management Planned: LMA  Additional Equipment:   Intra-op Plan:   Post-operative Plan: Extubation in OR  Informed Consent: I have reviewed the patients History and Physical, chart, labs and discussed the procedure including the risks, benefits and alternatives for the proposed anesthesia with the patient or authorized representative who has indicated his/her understanding and acceptance.   Dental advisory given  Plan Discussed with: CRNA  Anesthesia Plan Comments:         Anesthesia Quick Evaluation

## 2011-11-29 LAB — POCT HEMOGLOBIN-HEMACUE: Hemoglobin: 15.7 g/dL (ref 13.0–17.0)

## 2011-12-03 ENCOUNTER — Encounter (HOSPITAL_BASED_OUTPATIENT_CLINIC_OR_DEPARTMENT_OTHER): Payer: Self-pay

## 2011-12-04 ENCOUNTER — Encounter (HOSPITAL_BASED_OUTPATIENT_CLINIC_OR_DEPARTMENT_OTHER): Payer: Self-pay | Admitting: Orthopedic Surgery

## 2012-07-29 ENCOUNTER — Other Ambulatory Visit: Payer: Self-pay | Admitting: Orthopedic Surgery

## 2012-07-29 DIAGNOSIS — M25571 Pain in right ankle and joints of right foot: Secondary | ICD-10-CM

## 2012-07-30 ENCOUNTER — Ambulatory Visit
Admission: RE | Admit: 2012-07-30 | Discharge: 2012-07-30 | Disposition: A | Discharge status: Home / Self Care | Payer: PRIVATE HEALTH INSURANCE | Source: Ambulatory Visit | Attending: Orthopedic Surgery | Admitting: Orthopedic Surgery

## 2012-12-21 ENCOUNTER — Other Ambulatory Visit: Payer: Self-pay | Admitting: Rehabilitation

## 2012-12-21 DIAGNOSIS — M549 Dorsalgia, unspecified: Secondary | ICD-10-CM

## 2012-12-23 ENCOUNTER — Ambulatory Visit
Admission: RE | Admit: 2012-12-23 | Discharge: 2012-12-23 | Disposition: A | Discharge status: Home / Self Care | Payer: PRIVATE HEALTH INSURANCE | Source: Ambulatory Visit | Attending: Rehabilitation | Admitting: Rehabilitation

## 2012-12-23 DIAGNOSIS — M549 Dorsalgia, unspecified: Secondary | ICD-10-CM

## 2012-12-23 MED ORDER — GADOBENATE DIMEGLUMINE 529 MG/ML IV SOLN
20.0000 mL | Freq: Once | INTRAVENOUS | Status: AC | PRN
  Administered 2012-12-23: 20 mL via INTRAVENOUS

## 2013-03-08 HISTORY — PX: LAMINECTOMY: SHX219

## 2013-10-04 ENCOUNTER — Other Ambulatory Visit: Payer: Self-pay | Admitting: Orthopaedic Surgery

## 2013-10-04 DIAGNOSIS — M47816 Spondylosis without myelopathy or radiculopathy, lumbar region: Secondary | ICD-10-CM

## 2013-10-11 ENCOUNTER — Ambulatory Visit
Admission: RE | Admit: 2013-10-11 | Discharge: 2013-10-11 | Disposition: A | Discharge status: Home / Self Care | Payer: PRIVATE HEALTH INSURANCE | Source: Ambulatory Visit | Attending: Orthopaedic Surgery | Admitting: Orthopaedic Surgery

## 2013-10-11 DIAGNOSIS — M47816 Spondylosis without myelopathy or radiculopathy, lumbar region: Secondary | ICD-10-CM

## 2013-10-11 MED ORDER — GADOBENATE DIMEGLUMINE 529 MG/ML IV SOLN
20.0000 mL | Freq: Once | INTRAVENOUS | Status: AC | PRN
  Administered 2013-10-11: 20 mL via INTRAVENOUS

## 2013-12-31 ENCOUNTER — Other Ambulatory Visit: Payer: Self-pay | Admitting: Orthopaedic Surgery

## 2013-12-31 DIAGNOSIS — M545 Low back pain, unspecified: Secondary | ICD-10-CM

## 2014-01-09 ENCOUNTER — Ambulatory Visit
Admission: RE | Admit: 2014-01-09 | Discharge: 2014-01-09 | Disposition: A | Discharge status: Home / Self Care | Payer: PRIVATE HEALTH INSURANCE | Source: Ambulatory Visit | Attending: Orthopaedic Surgery | Admitting: Orthopaedic Surgery

## 2014-01-09 DIAGNOSIS — M545 Low back pain, unspecified: Secondary | ICD-10-CM

## 2014-12-06 HISTORY — PX: SPINAL CORD STIMULATOR IMPLANT: SHX2422

## 2016-05-31 ENCOUNTER — Ambulatory Visit (HOSPITAL_COMMUNITY)
Admission: RE | Admit: 2016-05-31 | Discharge: 2016-05-31 | Disposition: A | Discharge status: Home / Self Care | Payer: PRIVATE HEALTH INSURANCE | Source: Ambulatory Visit | Attending: Adult Health Nurse Practitioner | Admitting: Adult Health Nurse Practitioner

## 2016-05-31 ENCOUNTER — Other Ambulatory Visit (HOSPITAL_COMMUNITY): Payer: Self-pay | Admitting: Adult Health Nurse Practitioner

## 2016-05-31 DIAGNOSIS — M85872 Other specified disorders of bone density and structure, left ankle and foot: Secondary | ICD-10-CM | POA: Diagnosis not present

## 2016-05-31 DIAGNOSIS — M25472 Effusion, left ankle: Secondary | ICD-10-CM | POA: Diagnosis not present

## 2016-05-31 DIAGNOSIS — M25572 Pain in left ankle and joints of left foot: Secondary | ICD-10-CM | POA: Insufficient documentation

## 2017-03-12 ENCOUNTER — Other Ambulatory Visit (HOSPITAL_BASED_OUTPATIENT_CLINIC_OR_DEPARTMENT_OTHER): Payer: Self-pay

## 2017-03-12 DIAGNOSIS — R0683 Snoring: Secondary | ICD-10-CM

## 2017-03-17 ENCOUNTER — Ambulatory Visit: Payer: PRIVATE HEALTH INSURANCE | Attending: Internal Medicine | Admitting: Neurology

## 2017-03-17 DIAGNOSIS — R0683 Snoring: Secondary | ICD-10-CM | POA: Insufficient documentation

## 2017-03-20 NOTE — Procedures (Signed)
   [facility] Rober Skeels A. Gerilyn Pilgrim, MD     [email or web address]             NOCTURNAL POLYSOMNOGRAPHY   LOCATION: [facility]   Patient Name: [name], [name] Date: 03/17/2017 Gender: Male D.O.B: May 21, 1971 Age (years): 46 Referring Provider: Catalina Pizza Height (inches): 72 Interpreting Physician: Beryle Beams MD, ABSM Weight (lbs): 300 RPSGT: Alfonso Ellis BMI: 41 MRN: [id number] Neck Size: 18.50 CLINICAL INFORMATION Sleep Study Type: NPSG  Indication for sleep study: Snoring  Epworth Sleepiness Score: 4  SLEEP STUDY TECHNIQUE As per the AASM Manual for the Scoring of Sleep and Associated Events v2.3 (April 2016) with a hypopnea requiring 4% desaturations.  The channels recorded and monitored were frontal, central and occipital EEG, electrooculogram (EOG), submentalis EMG (chin), nasal and oral airflow, thoracic and abdominal wall motion, anterior tibialis EMG, snore microphone, electrocardiogram, and pulse oximetry.  MEDICATIONS Medications self-administered by patient taken the night of the study : N/A   Current Outpatient Prescriptions:  .  amphetamine-dextroamphetamine (ADDERALL) 7.5 MG tablet, Take 7.5 mg by mouth daily., Disp: , Rfl:  .  DULoxetine (CYMBALTA) 60 MG capsule, Take 60 mg by mouth daily., Disp: , Rfl:  .  fish oil-omega-3 fatty acids 1000 MG capsule, Take 2 g by mouth daily., Disp: , Rfl:  .  lisinopril (PRINIVIL,ZESTRIL) 10 MG tablet, Take 20 mg by mouth daily., Disp: , Rfl:  .  meloxicam (MOBIC) 15 MG tablet, Take 15 mg by mouth daily., Disp: , Rfl:  .  Multiple Vitamin (MULTIVITAMIN) tablet, Take 1 tablet by mouth daily., Disp: , Rfl:  .  omeprazole (PRILOSEC) 20 MG capsule, Take 20 mg by mouth daily., Disp: , Rfl:  .  pregabalin (LYRICA) 150 MG capsule, Take 150 mg by mouth 2 (two) times daily., Disp: , Rfl:    SLEEP ARCHITECTURE The study was initiated at 9:40:24 PM and ended at 5:13:17 AM.  Sleep onset time was 11.2 minutes  and the sleep efficiency was 69.1%. The total sleep time was 313.1 minutes.  Stage REM latency was N/A minutes.  The patient spent 8.46% of the night in stage N1 sleep, 75.41% in stage N2 sleep, 16.13% in stage N3 and 0.00% in REM.  Alpha intrusion was absent.  Supine sleep was 66.47%.  RESPIRATORY PARAMETERS The overall apnea/hypopnea index (AHI) was 0.0 per hour. There were 0 total apneas, including 0 obstructive, 0 central and 0 mixed apneas. There were 0 hypopneas and 0 RERAs.  The AHI during Stage REM sleep was N/A per hour.  AHI while supine was 0.0 per hour.  The mean oxygen saturation was 89.72%. The minimum SpO2 during sleep was 85.00%.  moderate snoring was noted during this study.  CARDIAC DATA The 2 lead EKG demonstrated sinus rhythm. The mean heart rate was N/A beats per minute. Other EKG findings include: None. LEG MOVEMENT DATA The total PLMS were 15 with a resulting PLMS index of 2.87. Associated arousal with leg movement index was 2.3.  IMPRESSIONS   Absent REM sleep is observed during this recording.  Otherwise, the study is unremarkable.  Argie Ramming, MD Diplomate, American Board of Sleep Medicine.  ELECTRONICALLY SIGNED ON:  03/20/2017, 6:48 PM [facility] PH: [phone]   FX: [phone] ACCREDITED BY THE AMERICAN ACADEMY OF SLEEP MEDICINE

## 2020-09-27 ENCOUNTER — Encounter: Payer: Self-pay | Admitting: *Deleted

## 2020-09-28 ENCOUNTER — Encounter: Payer: Self-pay | Admitting: *Deleted

## 2020-09-28 ENCOUNTER — Ambulatory Visit (INDEPENDENT_AMBULATORY_CARE_PROVIDER_SITE_OTHER): Payer: No Typology Code available for payment source

## 2020-09-28 ENCOUNTER — Encounter: Payer: Self-pay | Admitting: Cardiology

## 2020-09-28 ENCOUNTER — Ambulatory Visit (INDEPENDENT_AMBULATORY_CARE_PROVIDER_SITE_OTHER): Payer: No Typology Code available for payment source | Admitting: Cardiology

## 2020-09-28 ENCOUNTER — Other Ambulatory Visit: Payer: Self-pay | Admitting: Cardiology

## 2020-09-28 ENCOUNTER — Other Ambulatory Visit: Payer: Self-pay

## 2020-09-28 ENCOUNTER — Telehealth: Payer: Self-pay | Admitting: Cardiology

## 2020-09-28 VITALS — BP 104/70 | HR 72 | Ht 73.0 in | Wt 326.0 lb

## 2020-09-28 DIAGNOSIS — R002 Palpitations: Secondary | ICD-10-CM

## 2020-09-28 DIAGNOSIS — Z8249 Family history of ischemic heart disease and other diseases of the circulatory system: Secondary | ICD-10-CM

## 2020-09-28 DIAGNOSIS — E782 Mixed hyperlipidemia: Secondary | ICD-10-CM

## 2020-09-28 DIAGNOSIS — I493 Ventricular premature depolarization: Secondary | ICD-10-CM

## 2020-09-28 DIAGNOSIS — I10 Essential (primary) hypertension: Secondary | ICD-10-CM

## 2020-09-28 DIAGNOSIS — R0600 Dyspnea, unspecified: Secondary | ICD-10-CM | POA: Diagnosis not present

## 2020-09-28 DIAGNOSIS — R0609 Other forms of dyspnea: Secondary | ICD-10-CM

## 2020-09-28 NOTE — Telephone Encounter (Signed)
PERCERT:   GXT [id number]/40/9735 [facility]   14 DAY MONITOR

## 2020-09-28 NOTE — Progress Notes (Signed)
Cardiology Office Note  Date: 09/28/2020   ID: RYZEN DEADY, DOB [birth year], MRN [id number]  PCP:  Roe Rutherford, NP  Cardiologist:  Nona Dell, MD Electrophysiologist:  None   Chief Complaint  Patient presents with  . Palpitations    History of Present Illness: JGUADALUPE OPIELA is a 50 y.o. male referred for cardiology consultation by Ms. Arturo Morton NP for cardiac evaluation.  Main symptom of concern is a sense of palpitations, a twisting or skipped sensation.  He has captured some events with his apple watch, I reviewed these today, he does have episodes of PVCs and some ventricular bigeminy.  No atrial fibrillation noted.  He has been feeling the symptoms off and on for the last year or so.  He does not report any exertional chest pain but shortness of breath with activity.  He is trying to lose weight, states that he is down 60 pounds from his peak weight.  He does have a family history of premature CAD in his father.  Also personal history of hypertension hyperlipidemia which is being treated medically.  He works as the Best boy for [employer].  Past Medical History:  Diagnosis Date  . Depression   . Essential hypertension   . GERD (gastroesophageal reflux disease)   . Lumbar disc disease   . Migraine   . Mixed hyperlipidemia   . Peripheral neuropathy     Past Surgical History:  Procedure Laterality Date  . BACK SURGERY  04/29/2008  . CALCANEAL OSTEOTOMY  11/27/2011   Procedure: CALCANEAL OSTEOTOMY;  Surgeon: Sherri Rad, MD;  Location: [facility];  Service: Orthopedics;  Laterality: Right;  right lengthening calcaneal osteotomy with femoral head allograft, medializing calcaneal osteotomy, gastroc slide, excision ostrigoum, excision dorsal talonavicular loose body  . LAMINECTOMY  2009  . LAMINECTOMY  03/08/2013  . SPINAL CORD STIMULATOR IMPLANT  12/06/2014    Current Outpatient Medications  Medication Sig Dispense Refill  . celecoxib (CELEBREX)  200 MG capsule Take 200 mg by mouth 2 (two) times daily.    Marland Kitchen levocetirizine (XYZAL) 5 MG tablet Take 5 mg by mouth every evening.    Marland Kitchen lisinopril (ZESTRIL) 20 MG tablet Take 20 mg by mouth daily.    . Melatonin 10 MG TBCR Take by mouth.    . naloxone (NARCAN) nasal spray 4 mg/0.1 mL Place 1 spray into the nose.    Marland Kitchen omeprazole (PRILOSEC) 20 MG capsule Take 20 mg by mouth daily.    . pregabalin (LYRICA) 100 MG capsule Take 100 mg by mouth 2 (two) times daily.    . sertraline (ZOLOFT) 25 MG tablet Take 25 mg by mouth daily.    . simvastatin (ZOCOR) 20 MG tablet Take 20 mg by mouth daily at 6 PM.    . tapentadol (NUCYNTA) 50 MG tablet Take 50 mg by mouth every 6 (six) hours as needed.    Marland Kitchen tiZANidine (ZANAFLEX) 2 MG tablet Take 2 mg by mouth 3 (three) times daily.    Marland Kitchen zolpidem (AMBIEN) 10 MG tablet Take 10 mg by mouth at bedtime.    . fish oil-omega-3 fatty acids 1000 MG capsule Take 2 g by mouth daily.    Marland Kitchen lisinopril (PRINIVIL,ZESTRIL) 10 MG tablet Take 20 mg by mouth daily.     No current facility-administered medications for this visit.   Allergies:  Penicillins and Adhesive [tape]   Social History: The patient  reports that he has never smoked. He has never used smokeless  tobacco. He reports current alcohol use. He reports that he does not use drugs.   Family History: The patient's family history includes Aneurysm in his maternal grandfather; Arrhythmia in his father; Heart attack in his father; Heart failure in his father.   ROS: No sudden unexplained syncope.  Physical Exam: VS:  BP 104/70   Pulse 72   Ht 6\' 1"  (1.854 m)   Wt (!) 326 lb (147.9 kg)   SpO2 97%   BMI 43.01 kg/m , BMI Body mass index is 43.01 kg/m.  Wt Readings from Last 3 Encounters:  09/28/20 (!) 326 lb (147.9 kg)  11/20/11 (!) 305 lb (138.3 kg)    General: Patient appears comfortable at rest. HEENT: Conjunctiva and lids normal, wearing a mask. Neck: Supple, no elevated JVP or carotid bruits, no  thyromegaly. Lungs: Clear to auscultation, nonlabored breathing at rest. Cardiac: Regular rate and rhythm, no S3 or significant systolic murmur, no pericardial rub. Abdomen: Soft, bowel sounds present. Extremities: No pitting edema, distal pulses 2+. Skin: Warm and dry. Musculoskeletal: No kyphosis. Neuropsychiatric: Alert and oriented x3, affect grossly appropriate.  ECG:  An ECG dated 08/14/2020 was personally reviewed today and demonstrated:  Normal sinus rhythm with low voltage and decreased R wave progression.  Recent Labwork:  May 2019: Cholesterol 202, triglycerides 93, HDL 60, LDL 124  Other Studies Reviewed Today:  No prior cardiac testing for review.  Assessment and Plan:  1.  Fairly longstanding history of palpitations, most likely PVCs based on both description and captures from apple watch.  He has had no sudden unexplained syncope.  No history of cardiomyopathy.  We will obtain a 14-day Zio patch for further investigation.  2.  Dyspnea on exertion and family history of premature CAD.  We will obtain a GXT for screening.  3.  Essential hypertension, on lisinopril with normal blood pressure today.  4.  Mixed hyperlipidemia, on Zocor.  Medication Adjustments/Labs and Tests Ordered: Current medicines are reviewed at length with the patient today.  Concerns regarding medicines are outlined above.   Tests Ordered: Orders Placed This Encounter  Procedures  . EXERCISE TOLERANCE TEST (ETT)    Medication Changes: No orders of the defined types were placed in this encounter.   Disposition:  Follow up test results.  Signed, June 2019, MD, [facility] 09/28/2020 3:29 PM    [facility] [address] [place], [place], [place] Phone: [phone]; Fax: [phone]

## 2020-09-28 NOTE — Patient Instructions (Addendum)
Medication Instructions:   Your physician recommends that you continue on your current medications as directed. Please refer to the Current Medication list given to you today.  Labwork:  none  Testing/Procedures: Your physician has requested that you have an exercise tolerance test. For further information please visit [email or web address] Please also follow instruction sheet, as given. ZIO- Long Term Monitor Instructions   Your physician has requested you wear your ZIO patch monitor 14 days.   This is a single patch monitor.  [employer] supplies one patch monitor per enrollment.  Additional stickers are not available.   Please do not apply patch if you will be having a Nuclear Stress Test, Echocardiogram, Cardiac CT, MRI, or Chest Xray during the time frame you would be wearing the monitor. The patch cannot be worn during these tests.  You cannot remove and re-apply the ZIO XT patch monitor.     Once you have received you monitor, please review enclosed instructions.  Your monitor has already been registered assigning a specific monitor serial # to you.   Applying the monitor   Shave hair from upper left chest.   Hold abrader disc by orange tab.  Rub abrader in 40 strokes over left upper chest as indicated in your monitor instructions.   Clean area with 4 enclosed alcohol pads .  Use all pads to assure are is cleaned thoroughly.  Let dry.   Apply patch as indicated in monitor instructions.  Patch will be place under collarbone on left side of chest with arrow pointing upward.   Rub patch adhesive wings for 2 minutes.Remove white label marked "1".  Remove white label marked "2".  Rub patch adhesive wings for 2 additional minutes.   While looking in a mirror, press and release button in center of patch.  A small green light will flash 3-4 times .  This will be your only indicator the monitor has been turned on.     Do not shower for the first 24 hours.  You may shower after the first  24 hours.   Press button if you feel a symptom. You will hear a small click.  Record Date, Time and Symptom in the Patient Log Book.   When you are ready to remove patch, follow instructions on last 2 pages of Patient Log Book.  Stick patch monitor onto last page of Patient Log Book.   Place Patient Log Book in [place] box.  Use locking tab on box and tape box closed securely.  The [employer] and [employer] has [employer] on it.  Please place in mailbox as soon as possible.  Your physician should have your test results approximately 7 days after the monitor has been mailed back to [facility].   Call [facility] Customer Care at [phone] if you have questions regarding your ZIO XT patch monitor.  Call them immediately if you see an orange light blinking on your monitor.    If your monitor falls off in less than 4 days contact our Monitor department at [phone].  If your monitor becomes loose or falls off after 4 days call [employer] at [phone] for suggestions on securing your monitor.  Follow-Up:  Your physician recommends that you schedule a follow-up appointment in: pending.  Any Other Special Instructions Will Be Listed Below (If Applicable).  If you need a refill on your cardiac medications before your next appointment, please call your pharmacy.

## 2020-10-18 ENCOUNTER — Telehealth: Payer: Self-pay | Admitting: *Deleted

## 2020-10-18 NOTE — Telephone Encounter (Signed)
-----   Message from Jonelle Sidle, MD sent at 10/18/2020 10:45 AM EDT ----- Results reviewed.  Cardiac monitor shows normal sinus rhythm.  Rare PACs were noted as well as occasional PVCs which is what we suspected based on his symptom description and Apple Watch captures.  He does have episodes of ventricular bigeminy and trigeminy which he probably feels, but importantly no sustained arrhythmias of concern.  Follow-up for GXT as scheduled.

## 2020-10-18 NOTE — Telephone Encounter (Signed)
Patient informed and verbalized understanding of plan. Copy sent to PCP 

## 2020-10-20 ENCOUNTER — Ambulatory Visit (HOSPITAL_COMMUNITY)
Admission: RE | Admit: 2020-10-20 | Discharge: 2020-10-20 | Disposition: A | Discharge status: Home / Self Care | Payer: No Typology Code available for payment source | Source: Ambulatory Visit | Attending: Cardiology | Admitting: Cardiology

## 2020-10-20 ENCOUNTER — Other Ambulatory Visit: Payer: Self-pay

## 2020-10-20 DIAGNOSIS — Z8249 Family history of ischemic heart disease and other diseases of the circulatory system: Secondary | ICD-10-CM | POA: Diagnosis not present

## 2020-10-20 DIAGNOSIS — R0600 Dyspnea, unspecified: Secondary | ICD-10-CM | POA: Insufficient documentation

## 2020-10-20 DIAGNOSIS — R0609 Other forms of dyspnea: Secondary | ICD-10-CM

## 2020-10-20 LAB — EXERCISE TOLERANCE TEST
Estimated workload: 6.8 METS
Exercise duration (min): 3 min
Exercise duration (sec): 55 s
MPHR: 172 {beats}/min
Peak HR: 153 {beats}/min
Percent HR: 88 %
RPE: 17
Rest HR: 66 {beats}/min

## 2020-10-24 ENCOUNTER — Telehealth: Payer: Self-pay | Admitting: *Deleted

## 2020-10-24 NOTE — Telephone Encounter (Signed)
-----   Message from Jonelle Sidle, MD sent at 10/20/2020  3:28 PM EDT ----- Results reviewed.  Treadmill test did not show any ischemic ST segment changes and importantly PVCs were suppressed during exercise which is reassuring.  [facility] treadmill score was intermediate risk based on his limited exercise time.  Would recommend continued efforts at weight loss, increasing activity level and also risk factor modification through management of blood pressure and lipid status.  No further cardiac testing planned now unless his symptoms increase.

## 2020-10-24 NOTE — Telephone Encounter (Signed)
Patient informed. Copy sent to PCP °

## 2021-12-16 LAB — EXTERNAL GENERIC LAB PROCEDURE: COLOGUARD: NEGATIVE

## 2023-08-29 ENCOUNTER — Ambulatory Visit (HOSPITAL_COMMUNITY)
Admission: RE | Admit: 2023-08-29 | Discharge: 2023-08-29 | Disposition: A | Discharge status: Home / Self Care | Source: Ambulatory Visit | Attending: Sports Medicine | Admitting: Sports Medicine

## 2023-08-29 ENCOUNTER — Other Ambulatory Visit (HOSPITAL_COMMUNITY): Payer: Self-pay | Admitting: Sports Medicine

## 2023-08-29 DIAGNOSIS — M79662 Pain in left lower leg: Secondary | ICD-10-CM | POA: Insufficient documentation

## 2023-08-29 NOTE — Progress Notes (Signed)
 Left lower extremity venous duplex has been completed. Preliminary results can be found in CV Proc through chart review.  Results were given to [facility] at Dr. Markus Jarvis office.  08/29/23 1:55 PM Olen Cordial RVT
